# Patient Record
Sex: Male | Born: 1980 | Race: White | Hispanic: No | Smoking: Current some day smoker
Health system: Southern US, Community
[De-identification: ages and names within clinical notes are randomized; demographics above are authoritative.]

---

## 2006-07-09 ENCOUNTER — Ambulatory Visit (HOSPITAL_COMMUNITY): Admission: RE | Admit: 2006-07-09 | Discharge: 2006-07-09 | Payer: Self-pay | Admitting: Chiropractic Medicine

## 2006-07-13 ENCOUNTER — Encounter: Admission: RE | Admit: 2006-07-13 | Discharge: 2006-07-13 | Payer: Self-pay | Admitting: Family Medicine

## 2006-09-11 ENCOUNTER — Encounter: Admission: RE | Admit: 2006-09-11 | Discharge: 2006-09-11 | Payer: Self-pay | Admitting: Family Medicine

## 2006-09-29 ENCOUNTER — Emergency Department (HOSPITAL_COMMUNITY): Admission: EM | Admit: 2006-09-29 | Discharge: 2006-09-29 | Payer: Self-pay | Admitting: Emergency Medicine

## 2008-10-13 ENCOUNTER — Emergency Department (HOSPITAL_COMMUNITY): Admission: EM | Admit: 2008-10-13 | Discharge: 2008-10-13 | Payer: Self-pay | Admitting: Emergency Medicine

## 2008-10-29 ENCOUNTER — Ambulatory Visit (HOSPITAL_COMMUNITY): Admission: RE | Admit: 2008-10-29 | Discharge: 2008-10-29 | Payer: Self-pay | Admitting: Orthopedic Surgery

## 2010-05-20 ENCOUNTER — Encounter: Payer: Self-pay | Admitting: Critical Care Medicine

## 2010-05-20 ENCOUNTER — Ambulatory Visit
Admission: RE | Admit: 2010-05-20 | Discharge: 2010-05-20 | Payer: Self-pay | Source: Home / Self Care | Attending: Critical Care Medicine | Admitting: Critical Care Medicine

## 2010-05-20 DIAGNOSIS — F17200 Nicotine dependence, unspecified, uncomplicated: Secondary | ICD-10-CM | POA: Insufficient documentation

## 2010-06-05 ENCOUNTER — Encounter: Payer: Self-pay | Admitting: Chiropractic Medicine

## 2010-06-05 ENCOUNTER — Encounter: Payer: Self-pay | Admitting: Family Medicine

## 2010-06-16 NOTE — Assessment & Plan Note (Signed)
Summary: Pulmonary Consultation   Copy to:  n/a Primary Provider/Referring Provider:  n/a  CC:  Pulmonary Consult - smoking cessation.Marland Kitchen  History of Present Illness: Pulmonary Consultation       This is a 30 year old male who presents for consultation at the request of n/a for initial Pulmonary consult.  The patient has no history of fever, fatigue, failure to thrive, weight loss, night sweats, nosebleeds, easy bruising, heartburn, nausea/vomiting, unable to take oral fluids/nourishment, chest pain, cough, shortness of breath, shortness of breath with exertion, hemoptysis, joint swelling, leg swelling, peripheral edema, and URI symptoms.  Today's visit is for recommendations for treatment.    This pt desires to quit smoking. Smokes 1PPD for >85yrs No current pulm symptoms except dyspneic with extreme exertion.   Preventive Screening-Counseling & Management  Alcohol-Tobacco     Smoking Status: current     Smoking Cessation Counseling: yes     Packs/Day: 1.0     Pack years: 13  Current Medications (verified): 1)  None  Allergies (verified): No Known Drug Allergies  Past History:  Past medical, surgical, family and social histories (including risk factors) reviewed, and no changes noted (except as noted below).  Past Medical History: NIICOTINE ADDICTION (ICD-305.1)  Past Surgical History: right knee surgery  Family History: Reviewed history and no changes required. non contributory  Social History: Reviewed history and no changes required. Current smoker.  18 cig/day.  Started smoking at 17. married 1 child Packs/Day:  1.0 Smoking Status:  current Pack years:  13  Review of Systems       The patient complains of shortness of breath with activity.  The patient denies shortness of breath at rest, productive cough, non-productive cough, coughing up blood, chest pain, irregular heartbeats, acid heartburn, indigestion, loss of appetite, weight change, abdominal pain,  difficulty swallowing, sore throat, tooth/dental problems, headaches, nasal congestion/difficulty breathing through nose, sneezing, itching, ear ache, anxiety, depression, hand/feet swelling, joint stiffness or pain, rash, change in color of mucus, and fever.    Vital Signs:  Patient profile:   30 year old male Height:      71 inches Weight:      230.38 pounds BMI:     32.25 O2 Sat:      96 % on Room air Temp:     98.3 degrees F oral Pulse rate:   64 / minute BP sitting:   136 / 90  (right arm) Cuff size:   large  Vitals Entered By: Gweneth Dimitri RN (May 20, 2010 2:41 PM)  O2 Flow:  Room air CC: Pulmonary Consult - smoking cessation. Comments Medications reviewed with patient Daytime contact number verified with patient. Gweneth Dimitri RN  May 20, 2010 2:37 PM    Physical Exam  Additional Exam:  Gen: Pleasant, well-nourished, in no distress,  normal affect ENT: No lesions,  mouth clear,  oropharynx clear, no postnasal drip Neck: No JVD, no TMG, no carotid bruits Lungs: No use of accessory muscles, no dullness to percussion, clear without rales or rhonchi Cardiovascular: RRR, heart sounds normal, no murmur or gallops, no peripheral edema Abdomen: soft and NT, no HSM,  BS normal Musculoskeletal: No deformities, no cyanosis or clubbing Neuro: alert, non focal Skin: Warm, no lesions or rashes    Impression & Recommendations:  Problem # 1:  NICOTINE ADDICTION (ICD-305.1) ongoing nicotine addiction.  no real pulm disease process.  spiro is normal plan >27min tobacco counselling given rx chantix His updated medication list for  this problem includes:    Chantix Starting Month Pak 0.5 Mg X 11 & 1 Mg X 42 Misc (Varenicline tartrate) .Marland Kitchen... Take as directed---refills are to be for the continuing pak  Orders: Spirometry w/Graph (94010) New Patient Level V (21308) Tobacco use cessation intensive >10 minutes (65784)  Medications Added to Medication List This Visit: 1)   Chantix Starting Month Pak 0.5 Mg X 11 & 1 Mg X 42 Misc (Varenicline tartrate) .... Take as directed---refills are to be for the continuing pak  Complete Medication List: 1)  Chantix Starting Month Pak 0.5 Mg X 11 & 1 Mg X 42 Misc (Varenicline tartrate) .... Take as directed---refills are to be for the continuing pak  Patient Instructions: 1)  Start Chantix 2)  Return 1 month for recheck or you can call my cell phone for follow up on your smoking [phone] Prescriptions: CHANTIX STARTING MONTH PAK 0.5 MG X 11 & 1 MG X 42  MISC (VARENICLINE TARTRATE) Take as directed---Refills are to be for the continuing pak  #1 month2 x 2   Entered and Authorized by:   Storm Frisk MD   Signed by:   Storm Frisk MD on 05/20/2010   Method used:   Electronically to        [facility] [address] [phone]* (retail)       [address]       [place], [place]  [address]       Ph: [phone] or [phone]       Fax: [phone]   RxID:   [id number]    Immunization History:  Influenza Immunization History:    Influenza:  historical (02/12/2010)

## 2010-08-22 LAB — CBC
MCV: 87.9 fL (ref 78.0–100.0)
RBC: 5.47 MIL/uL (ref 4.22–5.81)
WBC: 11.1 10*3/uL — ABNORMAL HIGH (ref 4.0–10.5)

## 2010-08-22 LAB — COMPREHENSIVE METABOLIC PANEL
ALT: 58 U/L — ABNORMAL HIGH (ref 0–53)
AST: 29 U/L (ref 0–37)
Alkaline Phosphatase: 76 U/L (ref 39–117)
CO2: 27 mEq/L (ref 19–32)
Chloride: 107 mEq/L (ref 96–112)
Creatinine, Ser: 0.89 mg/dL (ref 0.4–1.5)
GFR calc Af Amer: >60 mL/min (ref >60)
GFR calc non Af Amer: >60 mL/min (ref >60)
Sodium: 140 mEq/L (ref 135–145)
Total Bilirubin: 0.5 mg/dL (ref 0.3–1.2)

## 2010-09-27 NOTE — Op Note (Signed)
Philip Cole, Philip Cole     ACCOUNT NO.:  [id number]   MEDICAL RECORD NO.:  [id number]          PATIENT TYPE:  AMB   LOCATION:  SDS                          FACILITY:  [facility]   PHYSICIAN:  Nadara Mustard, MD     DATE OF BIRTH:  06-15-1980   DATE OF PROCEDURE:  10/29/2008  DATE OF DISCHARGE:  10/29/2008                               OPERATIVE REPORT   PREOPERATIVE DIAGNOSIS:  Lock medial meniscal tear, right knee.   POSTOPERATIVE DIAGNOSIS:  Lock medial meniscal tear, right knee.   PROCEDURE:  Partial medial meniscectomy, right knee.   SURGEON:  Nadara Mustard, MD   ANESTHESIA:  General.   ESTIMATED BLOOD LOSS:  Minimal.   ANTIBIOTICS:  Kefzol 1 g.   DRAINS:  None.   COMPLICATIONS:  None.   TOURNIQUET TIME:  None.   DISPOSITION:  To PACU in stable condition.   INDICATIONS FOR PROCEDURE:  The patient is a 30 year old gentleman with  a locked medial meniscal tear.  He has undergone conservative care  including injections, failed conservative care.  Has pain with  activities of daily living, pain at rest, and presents at this time for  arthroscopic intervention.  Risks and benefits were discussed including  infection, neurovascular injury, persistent pain, need for additional  surgery.  The patient states he understands and wished proceed at this  time.   PROCEDURE:  The patient was brought to OR room #4 and underwent a  general anesthetic.  After adequate level of anesthesia was obtained,  the patient's right lower extremity was prepped using DuraPrep and  draped into a sterile field.  The scope was inserted through the  inferior lateral port and inferior medial working portal was  established.  Visualization showed a large locked medial meniscal tear.  Using the biter, this was debrided from the anterior and posterior rims  of the medial meniscal tear.  This was resected in one block of tissue  with a grabber.  The shaver was then used to smooth off the edge of  the  meniscus.  The probe was used to probe to ensure there is no other loose  fragments.  The knee was inspected in all 3 compartments and ensured  that there were no loose bodies.  The lateral joint line was evaluated.  There were no meniscal tears or the lateral joint line.  The ACL was  intact.  The patellofemoral joint was intact as well with no loose  bodies.  No articular cartilage defects of the patellofemoral joint or  the lateral joint line or the medial joint line.  He had a large plica  which was also excised.  Survey again of all 3 compartments showed there  to be no loose bodies.  The instruments removed.  The portals were  closed using 3-0  nylon.  The joint was injected with a total of 20 mL of 0.5% Marcaine  plain.  The wounds were covered with Adaptic, orthopedic sponges, ABD  dressing, Webril, and Coban.  The patient was then extubated, taken to  the PACU in stable condition.  He will follow up with me in the office  in  2 weeks.  Prescription for Tylox for pain.      Nadara Mustard, MD  Electronically Signed     MVD/MEDQ  D:  10/29/2008  T:  10/30/2008  Job:  [id number]

## 2013-05-13 ENCOUNTER — Other Ambulatory Visit: Payer: Self-pay | Admitting: Specialist

## 2013-05-13 DIAGNOSIS — M549 Dorsalgia, unspecified: Secondary | ICD-10-CM

## 2013-05-13 DIAGNOSIS — M542 Cervicalgia: Secondary | ICD-10-CM

## 2013-05-14 ENCOUNTER — Other Ambulatory Visit: Payer: Self-pay

## 2013-05-21 ENCOUNTER — Ambulatory Visit
Admission: RE | Admit: 2013-05-21 | Discharge: 2013-05-21 | Disposition: A | Discharge status: Home / Self Care | Payer: No Typology Code available for payment source | Source: Ambulatory Visit | Attending: Specialist | Admitting: Specialist

## 2013-05-21 VITALS — BP 115/80 | HR 69

## 2013-05-21 DIAGNOSIS — M542 Cervicalgia: Secondary | ICD-10-CM

## 2013-05-21 DIAGNOSIS — M549 Dorsalgia, unspecified: Secondary | ICD-10-CM

## 2013-05-21 MED ORDER — IOHEXOL 300 MG/ML  SOLN
10.0000 mL | Freq: Once | INTRAMUSCULAR | Status: AC | PRN
  Administered 2013-05-21: 10 mL via INTRATHECAL

## 2013-05-21 MED ORDER — DIAZEPAM 5 MG PO TABS
10.0000 mg | ORAL_TABLET | Freq: Once | ORAL | Status: AC
  Administered 2013-05-21: 10 mg via ORAL

## 2013-05-21 MED ORDER — ONDANSETRON HCL 4 MG/2ML IJ SOLN
4.0000 mg | Freq: Once | INTRAMUSCULAR | Status: AC
  Administered 2013-05-21: 4 mg via INTRAMUSCULAR

## 2013-05-21 MED ORDER — MORPHINE SULFATE 4 MG/ML IJ SOLN
6.0000 mg | Freq: Once | INTRAMUSCULAR | Status: AC
  Administered 2013-05-21: 6 mg via INTRAMUSCULAR

## 2013-05-21 NOTE — Discharge Instructions (Signed)

## 2013-05-26 ENCOUNTER — Other Ambulatory Visit: Payer: Self-pay

## 2014-11-01 IMAGING — RF DG MYELOGRAM 2+ REGIONS
12 of 24 series · 12 of 24 positions shown · non-contrast
Comparison: Lumbar spine MRI 07/13/2006

CLINICAL DATA: Left C7-C8 radiculopathy. Lumbar sciatica with pain
extending along the posterior left thigh below the knee. L4-5 disc
herniation on the prior MRI.
TECHNIQUE: Contiguous axial images were obtained through the Cervical and
Lumbar spine after the intrathecal infusion of infusion. Coronal and
sagittal reconstructions were obtained of the axial image sets.

[Series 2: myelogram  white · 1 of 1 slices shown (1 of 11)]
[im 1/1]
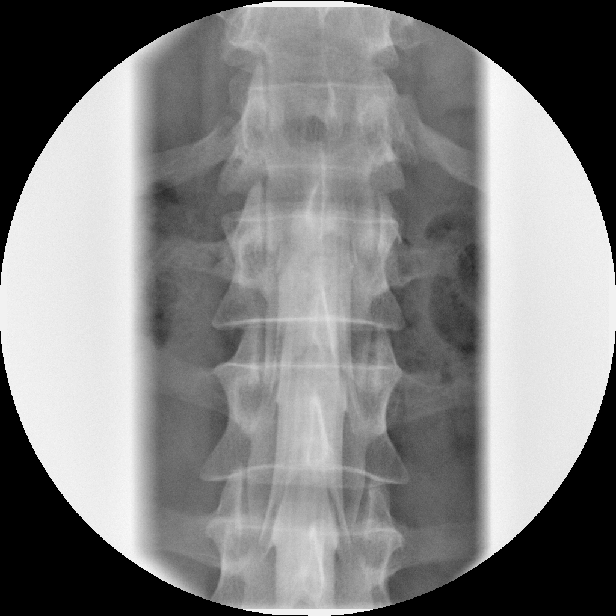

[Series 4: myelogram  white · 1 of 1 slices shown (2 of 11)]
[im 1/1]
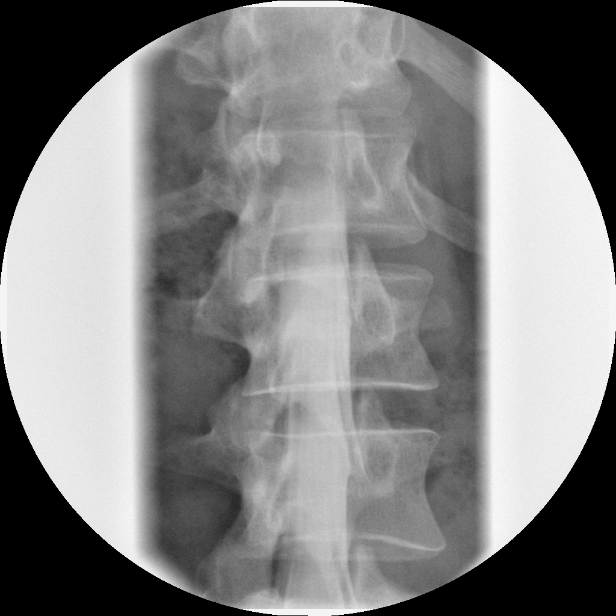

[Series 6: myelogram  white · 1 of 1 slices shown (3 of 11)]
[im 1/1]
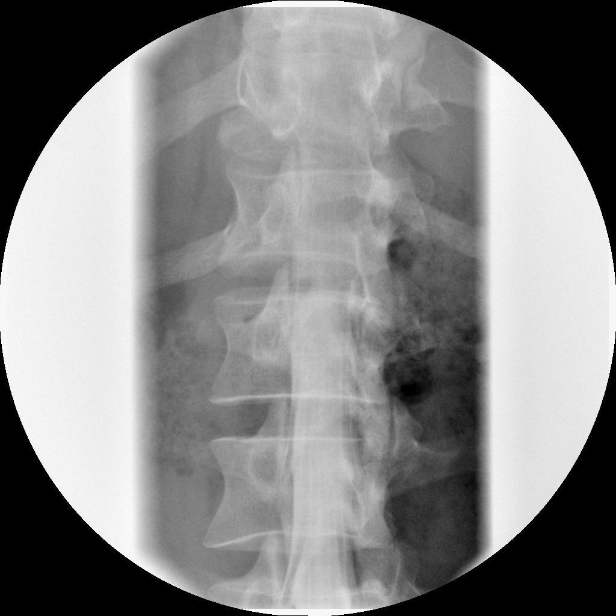

[Series 8: myelogram  white · 1 of 1 slices shown (4 of 11)]
[im 1/1]
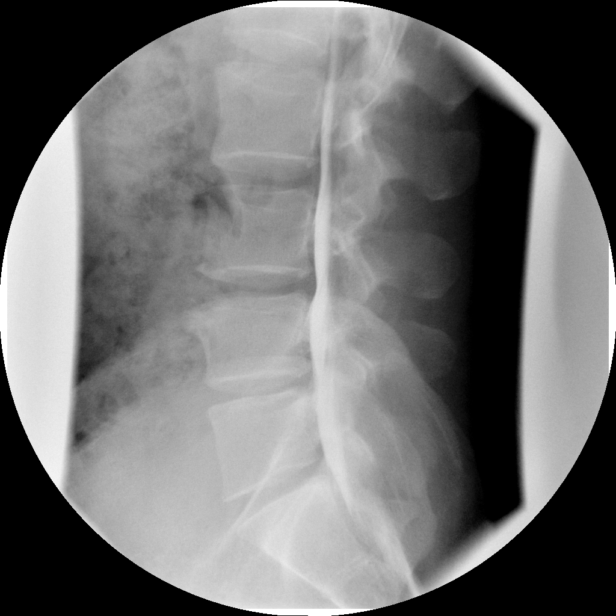

[Series 10: myelogram  white · 1 of 1 slices shown (5 of 11)]
[im 1/1]
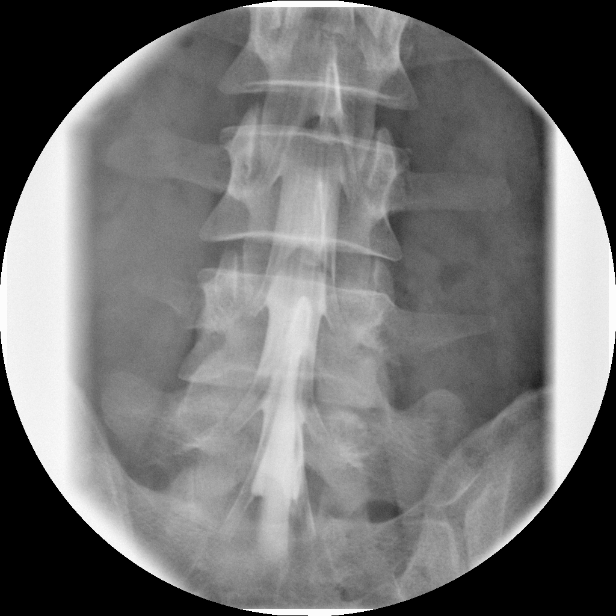

[Series 12: myelogram  white · 1 of 1 slices shown (6 of 11)]
[im 1/1]
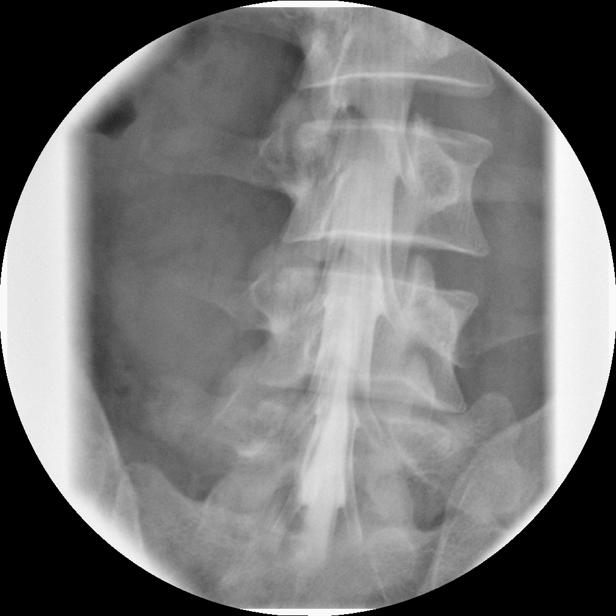

[Series 14: myelogram  white · 1 of 1 slices shown (7 of 11)]
[im 1/1]
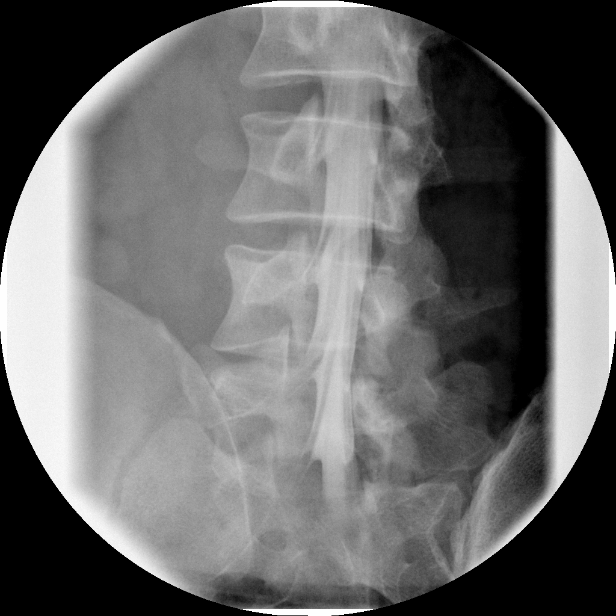

[Series 16: myelogram  white · 1 of 1 slices shown (8 of 11)]
[im 1/1]
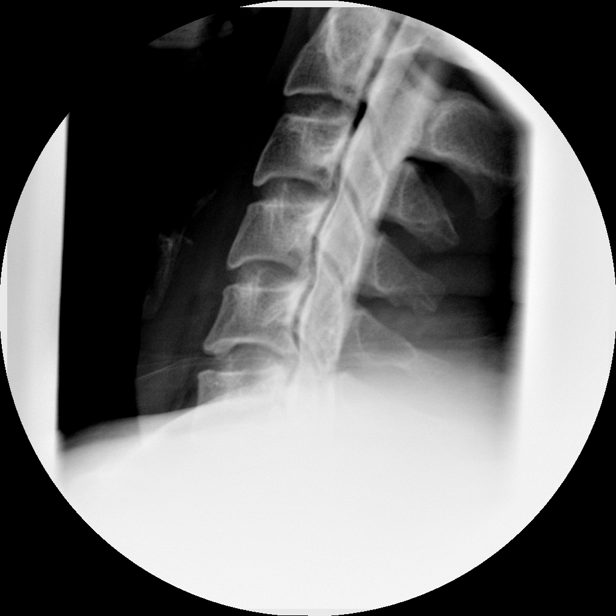

[Series 18: myelogram  white · 1 of 1 slices shown (9 of 11)]
[im 1/1]
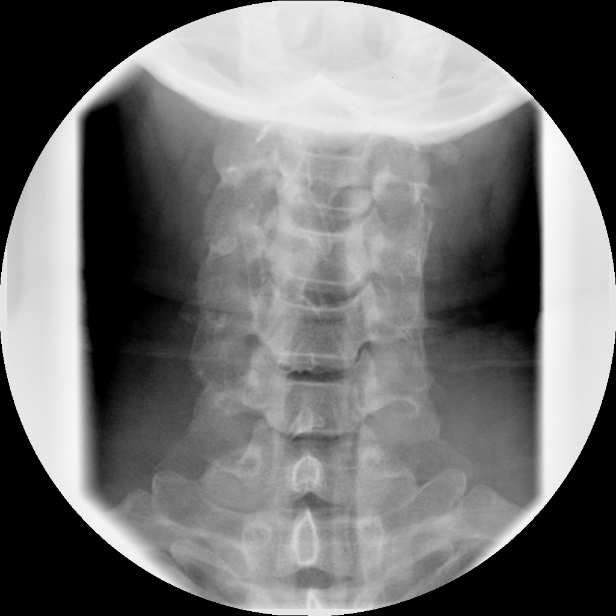

[Series 20: myelogram  white · 1 of 1 slices shown (10 of 11)]
[im 1/1]
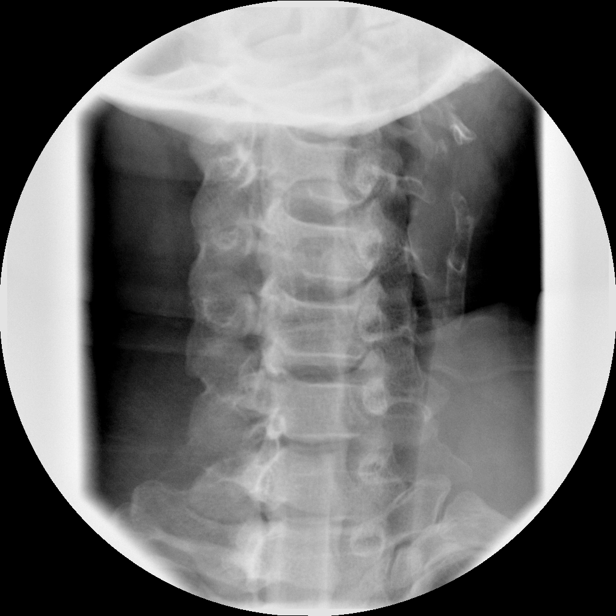

[Series 22: myelogram  white · 1 of 1 slices shown (11 of 11)]
[im 1/1]
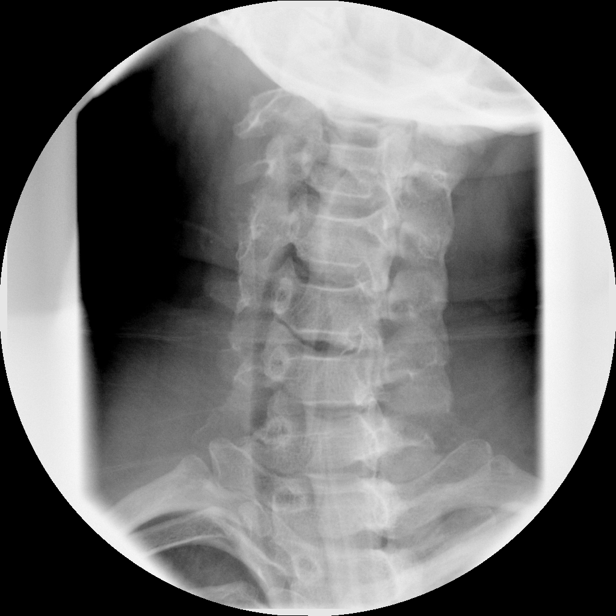

[Series 1002: view not recorded · 0.20mm/px · 1 of 1 slices shown]
[im 1/1]
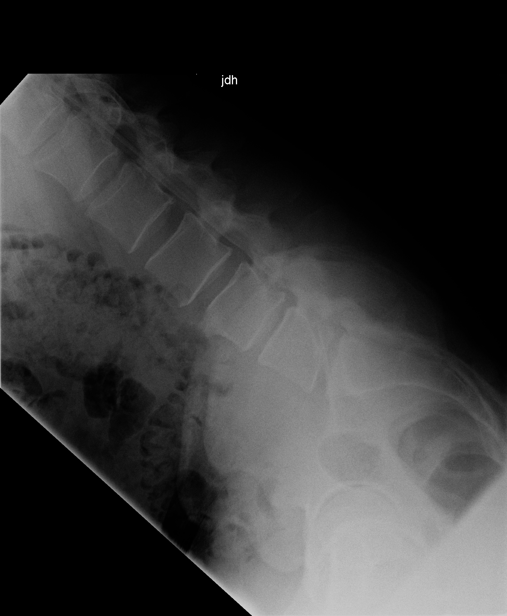

[12 of 24 positions shown; findings below may reference images not displayed]

FLUOROSCOPY TIME:  1 min 52 seconds

PROCEDURE:
LUMBAR PUNCTURE FOR CERVICAL AND LUMBAR MYELOGRAM

CERVICAL AND LUMBAR MYELOGRAM

CT CERVICAL MYELOGRAM

CT LUMBAR MYELOGRAM

After thorough discussion of risks and benefits of the procedure
including bleeding, infection, injury to nerves, blood vessels,
adjacent structures as well as headache and CSF leak, written and
oral informed consent was obtained. Consent was obtained by Dr.
Auntyjatty Delowr.

Patient was positioned prone on the fluoroscopy table. Local
anesthesia was provided with 1% lidocaine without epinephrine after
prepped and draped in the usual sterile fashion. Puncture was
performed at L3-4 using a 3 1/2 inch 22-gauge spinal needle via
right paramedian approach. Using a single pass through the dura, the
needle was placed within the thecal sac, with return of clear CSF. 9
mL of 2mnipaque-JAA was injected into the thecal sac, with normal
opacification of the nerve roots and cauda equina consistent with
free flow within the subarachnoid space. The patient was then moved
to the trendelenburg position and contrast flowed into the Cervical
spine region.

I personally performed the lumbar puncture and administered the
intrathecal contrast. I also personally supervised acquisition of
the myelogram images.
FINDINGS: CERVICAL AND LUMBAR MYELOGRAM FINDINGS:

There is mild to moderate disc space narrowing at L4-5 with mild
disc space narrowing noted at L3-4 and L5-S1. Anterior spurring is
noted at L3-4. Small anterior extradural defects are seen at L3-4
and L4-5. Bilateral lateral recess narrowing is seen at L4-5. There
is minimal retrolisthesis of L4 on L5 without change upon flexion or
extension. Posterior osseous spurring is present at C5-6 greater
than C4-5 with associated small anterior extradural defects. There
is no listhesis in the cervical spine.

CT CERVICAL MYELOGRAM FINDINGS:

Vertebral alignment is normal. Vertebral body heights are preserved.
Paraspinal soft tissues are unremarkable.

C2-3 and C3-4:  Negative.

C4-5: Trace disc bulge minimally flattens the ventral thecal sac
without spinal canal or neural foraminal stenosis.

C5-6: Mild disc bulge and uncovertebral arthrosis results in mild
spinal canal narrowing (AP diameter 9 mm) and mild left neural
foraminal narrowing.

C6-7: Minimal disc bulge without spinal canal or neural foraminal
stenosis.

C7-T1:  Negative.

CT LUMBAR MYELOGRAM FINDINGS:

Right-sided assimilation joint is noted at L5-S1. There is 3-4 mm of
retrolisthesis of L4 on L5. Moderate disc space narrowing is present
at L4-5, increased from prior MRI. Minimal disc space narrowing is
present at L3-4 and L5-S1. Anterior spurring is present superiorly
at L4. There is no compression fracture. Conus medullaris terminates
at T12-L1.

T12-L1 and L1-2:  Negative.

L2-3: Minimal disc bulge without spinal canal or neural foraminal
stenosis.

L3-4: Mild disc bulge results in minimal bilateral lateral recess
narrowing without spinal canal or neural foraminal stenosis.

L4-5: There is a broad-based disc bulge. The more focal, central
disc protrusion on the prior MRI has decreased. Narrowing of the
spinal canal has lessened from the prior MRI, now measuring
approximately 9 mm in diameter. There remains moderate to severe
bilateral lateral recess stenosis. There is mild to moderate
bilateral neural foraminal stenosis, increased from prior MRI.

L5-S1: Minimal disc bulge without spinal canal or neural foraminal
stenosis.
IMPRESSION: 1. Mild spinal canal stenosis and mild left neural foraminal
narrowing at C5-6 due to disc bulge and uncovertebral arthrosis.
2. Mild spinal stenosis at L4-5 due to a disc bulge, decreased from
prior MRI. The central disc protrusion on the MRI has regressed.
There is persistent bilateral lateral recess stenosis at this level
and increased, mild to moderate bilateral neural foraminal stenosis
due to progressive disc space height loss.
3. Minimal degenerative disc disease elsewhere in the lumbar spine.
4. Minimal retrolisthesis of L4 on L5 without change upon flexion or
extension.

## 2017-09-10 ENCOUNTER — Ambulatory Visit (INDEPENDENT_AMBULATORY_CARE_PROVIDER_SITE_OTHER): Payer: Self-pay | Admitting: Orthopaedic Surgery

## 2017-09-11 ENCOUNTER — Ambulatory Visit (INDEPENDENT_AMBULATORY_CARE_PROVIDER_SITE_OTHER): Payer: Self-pay | Admitting: Orthopaedic Surgery

## 2017-09-24 ENCOUNTER — Ambulatory Visit (INDEPENDENT_AMBULATORY_CARE_PROVIDER_SITE_OTHER): Payer: BLUE CROSS/BLUE SHIELD | Admitting: Orthopaedic Surgery

## 2017-09-24 ENCOUNTER — Encounter (INDEPENDENT_AMBULATORY_CARE_PROVIDER_SITE_OTHER): Payer: Self-pay | Admitting: Orthopaedic Surgery

## 2017-09-24 ENCOUNTER — Ambulatory Visit (INDEPENDENT_AMBULATORY_CARE_PROVIDER_SITE_OTHER): Payer: BLUE CROSS/BLUE SHIELD

## 2017-09-24 DIAGNOSIS — M25521 Pain in right elbow: Secondary | ICD-10-CM

## 2017-09-24 MED ORDER — METHYLPREDNISOLONE ACETATE 40 MG/ML IJ SUSP
40.0000 mg | INTRAMUSCULAR | Status: AC | PRN
  Administered 2017-09-24: 40 mg

## 2017-09-24 MED ORDER — LIDOCAINE HCL 1 % IJ SOLN
1.0000 mL | INTRAMUSCULAR | Status: AC | PRN
  Administered 2017-09-24: 1 mL

## 2017-09-24 MED ORDER — TRAMADOL HCL 50 MG PO TABS: ORAL_TABLET | ORAL | 0 refills | Status: AC

## 2017-09-24 MED ORDER — BUPIVACAINE HCL 0.25 % IJ SOLN
0.3300 mL | INTRAMUSCULAR | Status: AC | PRN
  Administered 2017-09-24: .33 mL

## 2017-09-24 NOTE — Progress Notes (Signed)
Office Visit Note   Patient: Philip Cole           Date of Birth: 1980-08-02           MRN: [id number] Visit Date: 09/24/2017              Requested by: No referring provider defined for this encounter. PCP: Patient, No Pcp Per   Assessment & Plan: Visit Diagnoses:  1. Pain in right elbow     Plan: Impression is lateral epicondylitis to the right elbow.  Today, we will inject this with cortisone.  I will also provide him a show Pat strap.  We will provide the patient with exercises specific for his condition.  He will follow-up with [place] on an as-needed basis.  Call with concerns or questions in the meantime  Follow-Up Instructions: No follow-ups on file.   Orders:  Orders Placed This Encounter  Procedures  . XR Elbow Complete Right (3+View)   Meds ordered this encounter  Medications  . traMADol (ULTRAM) 50 MG tablet    Sig: TAKE 1-2 TABS PO 6-8 HOURS PRN PAIN    Dispense:  30 tablet    Refill:  0      Procedures: Hand/UE Inj: R elbow for lateral epicondylitis on 09/24/2017 10:40 AM Indications: pain Details: 22 G needle Medications: 1 mL lidocaine 1 %; 0.33 mL bupivacaine 0.25 %; 40 mg methylPREDNISolone acetate 40 MG/ML      Clinical Data: No additional findings.   Subjective: Chief Complaint  Patient presents with  . Right Elbow - Pain    HPI patient is a pleasant 37 year old gentleman who presents to our clinic today with right elbow pain.  This began approximately 4 months ago when he started working out at [employer]  The pain he has is to the lateral aspect.  Worse with elbow extension, wrist extension as well as supination of the forearm.  He does note moderate weakness secondary to pain.  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  He has pain with this.  He does admit to numbness to the entire right hand which is worse at night.  He has a glove he wears which does seem to help.  He does note a history of a bulging  disc in his neck.  No previous epidural steroid injections or surgical intervention there.  Review of Systems as detailed in HPI.  All others reviewed and are negative.   Objective: Vital Signs: There were no vitals taken for this visit.  Physical Exam well-developed well-nourished gentleman no acute distress.  Alert and oriented x3.  Ortho Exam examination of the right elbow shows marked tenderness over the lateral epicondyle.  No tenderness over the radial tunnel.  Full range of motion of the elbow but he does have pain with full extension.  Increased pain with supination and extension of the wrist.  Decreased grip strength.  Sensory function intact distally.  Specialty Comments:  No specialty comments available.  Imaging: Xr Elbow Complete Right (3+view)  Result Date: 09/24/2017  right elbow is negative for acute fracture.  He does have a small spur off of his olecranon.    PMFS History: Patient Active Problem List   Diagnosis Date Noted  . NICOTINE ADDICTION 05/20/2010   No past medical history on file.  No family history on file.   Social History   Occupational History  . Not on file  Tobacco Use  . Smoking status: Current  Some Day Smoker  . Smokeless tobacco: Never Used  Substance and Sexual Activity  . Alcohol use: Not on file  . Drug use: Not on file  . Sexual activity: Not on file

## 2019-06-05 ENCOUNTER — Ambulatory Visit: Payer: No Typology Code available for payment source | Attending: Internal Medicine

## 2019-06-05 ENCOUNTER — Ambulatory Visit: Payer: Self-pay | Attending: Internal Medicine

## 2019-06-05 DIAGNOSIS — Z20822 Contact with and (suspected) exposure to covid-19: Secondary | ICD-10-CM

## 2019-06-06 LAB — NOVEL CORONAVIRUS, NAA: SARS-CoV-2, NAA: NOT DETECTED

## 2019-06-09 ENCOUNTER — Ambulatory Visit: Payer: No Typology Code available for payment source | Attending: Internal Medicine

## 2019-06-09 DIAGNOSIS — Z20822 Contact with and (suspected) exposure to covid-19: Secondary | ICD-10-CM

## 2019-06-10 LAB — NOVEL CORONAVIRUS, NAA: SARS-CoV-2, NAA: NOT DETECTED

## 2019-06-19 ENCOUNTER — Ambulatory Visit: Payer: No Typology Code available for payment source | Attending: Internal Medicine

## 2019-06-19 ENCOUNTER — Other Ambulatory Visit: Payer: No Typology Code available for payment source

## 2019-06-19 DIAGNOSIS — Z20822 Contact with and (suspected) exposure to covid-19: Secondary | ICD-10-CM

## 2019-06-20 LAB — NOVEL CORONAVIRUS, NAA: SARS-CoV-2, NAA: DETECTED — AB
